# Patient Record
Sex: Male | Born: 1985 | Race: White | Hispanic: No | Marital: Single
Health system: Midwestern US, Community
[De-identification: ages and names within clinical notes are randomized; demographics above are authoritative.]

## PROBLEM LIST (undated history)

## (undated) HISTORY — PX: JOINT REPLACEMENT: SHX530

---

## 2016-06-23 ENCOUNTER — Encounter (HOSPITAL_BASED_OUTPATIENT_CLINIC_OR_DEPARTMENT_OTHER): Payer: Self-pay | Admitting: Emergency Medicine

## 2016-06-23 ENCOUNTER — Emergency Department (HOSPITAL_BASED_OUTPATIENT_CLINIC_OR_DEPARTMENT_OTHER)
Admission: EM | Admit: 2016-06-23 | Discharge: 2016-06-23 | Disposition: A | Discharge status: Home / Self Care | Payer: 59 | Attending: Emergency Medicine | Admitting: Emergency Medicine

## 2016-06-23 DIAGNOSIS — E860 Dehydration: Secondary | ICD-10-CM | POA: Diagnosis not present

## 2016-06-23 DIAGNOSIS — R109 Unspecified abdominal pain: Secondary | ICD-10-CM | POA: Insufficient documentation

## 2016-06-23 DIAGNOSIS — R079 Chest pain, unspecified: Secondary | ICD-10-CM | POA: Insufficient documentation

## 2016-06-23 DIAGNOSIS — H539 Unspecified visual disturbance: Secondary | ICD-10-CM | POA: Insufficient documentation

## 2016-06-23 DIAGNOSIS — J029 Acute pharyngitis, unspecified: Secondary | ICD-10-CM | POA: Diagnosis not present

## 2016-06-23 DIAGNOSIS — F17210 Nicotine dependence, cigarettes, uncomplicated: Secondary | ICD-10-CM | POA: Insufficient documentation

## 2016-06-23 DIAGNOSIS — R112 Nausea with vomiting, unspecified: Secondary | ICD-10-CM

## 2016-06-23 LAB — CBC WITH DIFFERENTIAL/PLATELET
Basophils Absolute: 0 10*3/uL (ref 0.0–0.1)
Basophils Relative: 0 %
EOS ABS: 0.1 10*3/uL (ref 0.0–0.7)
EOS PCT: 1 %
HCT: 47.2 % (ref 39.0–52.0)
Hemoglobin: 17 g/dL (ref 13.0–17.0)
LYMPHS ABS: 1.1 10*3/uL (ref 0.7–4.0)
Lymphocytes Relative: 14 %
MCH: 32.6 pg (ref 26.0–34.0)
MCHC: 36 g/dL (ref 30.0–36.0)
MCV: 90.4 fL (ref 78.0–100.0)
MONOS PCT: 10 %
Monocytes Absolute: 0.8 10*3/uL (ref 0.1–1.0)
Neutro Abs: 6.1 10*3/uL (ref 1.7–7.7)
Neutrophils Relative %: 75 %
PLATELETS: 236 10*3/uL (ref 150–400)
RBC: 5.22 MIL/uL (ref 4.22–5.81)
RDW: 12.6 % (ref 11.5–15.5)
WBC: 8.1 10*3/uL (ref 4.0–10.5)

## 2016-06-23 LAB — COMPREHENSIVE METABOLIC PANEL
ALT: 32 U/L (ref 17–63)
ANION GAP: 13 (ref 5–15)
AST: 49 U/L — ABNORMAL HIGH (ref 15–41)
Albumin: 4.6 g/dL (ref 3.5–5.0)
Alkaline Phosphatase: 78 U/L (ref 38–126)
BUN: 14 mg/dL (ref 6–20)
CHLORIDE: 98 mmol/L — AB (ref 101–111)
CO2: 26 mmol/L (ref 22–32)
Calcium: 10 mg/dL (ref 8.9–10.3)
Creatinine, Ser: 1.04 mg/dL (ref 0.61–1.24)
GFR calc non Af Amer: >60 mL/min (ref >60)
Glucose, Bld: 127 mg/dL — ABNORMAL HIGH (ref 65–99)
Potassium: 3.2 mmol/L — ABNORMAL LOW (ref 3.5–5.1)
SODIUM: 137 mmol/L (ref 135–145)
Total Bilirubin: 1.7 mg/dL — ABNORMAL HIGH (ref 0.3–1.2)
Total Protein: 8.4 g/dL — ABNORMAL HIGH (ref 6.5–8.1)

## 2016-06-23 LAB — URINALYSIS, ROUTINE W REFLEX MICROSCOPIC
Glucose, UA: NEGATIVE mg/dL
Hgb urine dipstick: NEGATIVE
Ketones, ur: 15 mg/dL — AB
Nitrite: NEGATIVE
Protein, ur: 100 mg/dL — AB
Specific Gravity, Urine: 1.028 (ref 1.005–1.030)
pH: 8 (ref 5.0–8.0)

## 2016-06-23 LAB — ETHANOL: Alcohol, Ethyl (B): <5 mg/dL (ref <5)

## 2016-06-23 LAB — LIPASE, BLOOD: Lipase: 32 U/L (ref 11–51)

## 2016-06-23 LAB — URINALYSIS, MICROSCOPIC (REFLEX)

## 2016-06-23 MED ORDER — ONDANSETRON HCL 4 MG/2ML IJ SOLN
4.0000 mg | Freq: Once | INTRAMUSCULAR | Status: AC
  Administered 2016-06-23: 4 mg via INTRAVENOUS
  Filled 2016-06-23: qty 2

## 2016-06-23 MED ORDER — SODIUM CHLORIDE 0.9 % IV BOLUS (SEPSIS)
1000.0000 mL | Freq: Once | INTRAVENOUS | Status: AC
  Administered 2016-06-23: 1000 mL via INTRAVENOUS

## 2016-06-23 MED ORDER — FAMOTIDINE 20 MG PO TABS: 20.0000 mg | ORAL_TABLET | Freq: Two times a day (BID) | ORAL | 0 refills | Status: AC

## 2016-06-23 MED ORDER — ONDANSETRON HCL 4 MG PO TABS: 4.0000 mg | ORAL_TABLET | Freq: Four times a day (QID) | ORAL | 0 refills | Status: AC

## 2016-06-23 MED ORDER — FAMOTIDINE IN NACL 20-0.9 MG/50ML-% IV SOLN
20.0000 mg | Freq: Once | INTRAVENOUS | Status: AC
  Administered 2016-06-23: 20 mg via INTRAVENOUS
  Filled 2016-06-23: qty 50

## 2016-06-23 MED ORDER — POTASSIUM CHLORIDE CRYS ER 20 MEQ PO TBCR
60.0000 meq | EXTENDED_RELEASE_TABLET | Freq: Once | ORAL | Status: AC
  Administered 2016-06-23: 60 meq via ORAL
  Filled 2016-06-23: qty 3

## 2016-06-23 NOTE — ED Notes (Signed)
Crackers and ginger ale given to patient.

## 2016-06-23 NOTE — ED Triage Notes (Signed)
Per Mom, has been binge drinking this week since breaking up with girlfriend. Last had ETOH yesterday at 0200. N/V since yesterday, no diarrhea. Anxious, not sleeping

## 2016-06-23 NOTE — ED Provider Notes (Signed)
[facility] Provider Note   CSN: [id number] Arrival date & time: 06/23/16  1603  By signing my name below, I, Brad Brandt, attest that this documentation has been prepared under the direction and in the presence of Brad Ream, PA-C. Electronically Signed: Alyssa Brandt, ED Scribe. 06/23/16. 5:24 PM.   History   Chief Complaint Chief Complaint  Patient presents with  . Vomiting   The history is provided by the patient and a parent. No language interpreter was used.   HPI Comments: Brad Brandt is a 31 y.o. male who presents to the Emergency Department complaining of persistent vomiting beginning yesterday morning. Pt states he was binge drinking from 2/26-3/2 and consumed about 18 beers and 6-7 shots of liquor each day. He reports associated tremors, abdominal pain, sore throat, diaphoresis, intermittent lightheadedness, headache, nausea, chest pain. He states chest pain and sore throat are secondary to vomiting. No treatments tried. Pt reports occasional marijuana usage, but not within the last week. Denies other illicit drug usage. Denies visual or auditory hallucinations.  Mother is concerned about pt's increased "mouth breathing" and increased facial redness.  History reviewed. No pertinent past medical history.  There are no active problems to display for this patient.   Past Surgical History:  Procedure Laterality Date  . JOINT REPLACEMENT         Home Medications    Prior to Admission medications   Medication Sig Start Date End Date Taking? Authorizing Provider  famotidine (PEPCID) 20 MG tablet Take 1 tablet (20 mg total) by mouth 2 (two) times daily. 06/23/16   Brad Brandt M Argusta Mcgann, PA-C  ondansetron (ZOFRAN) 4 MG tablet Take 1 tablet (4 mg total) by mouth every 6 (six) hours. 06/23/16   Emi Holes, PA-C   Family History History reviewed. No pertinent family history.  Social History Social History  Substance Use Topics  . Smoking status: Current Every  Day Smoker    Types: Cigarettes  . Smokeless tobacco: Never Used  . Alcohol use Yes     Comment: usually drinks on weekends but has been on a binge this week    Allergies   Patient has no known allergies.  Review of Systems Review of Systems  Constitutional: Positive for diaphoresis. Negative for chills and fever.  HENT: Positive for sore throat. Negative for facial swelling.   Eyes: Positive for visual disturbance.  Respiratory: Negative for shortness of breath.   Cardiovascular: Positive for chest pain.  Gastrointestinal: Positive for abdominal pain, nausea and vomiting.  Genitourinary: Negative for dysuria.  Musculoskeletal: Negative for back pain.  Skin: Negative for rash and wound.  Neurological: Positive for tremors, light-headedness and headaches.  Psychiatric/Behavioral: Negative for hallucinations. The patient is not nervous/anxious.    Physical Exam Updated Vital Signs BP 142/79 (BP Location: Right Arm)   Pulse 66   Temp 97.8 F (36.6 C) (Oral)   Resp 18   Ht 6\' 2"  (1.88 m)   Wt 82.6 kg   SpO2 98%   BMI 23.37 kg/m   Physical Exam  Constitutional: He appears well-developed and well-nourished. No distress.  flushed  HENT:  Head: Normocephalic and atraumatic.  Mouth/Throat: Oropharynx is clear and moist. No oropharyngeal exudate.  Eyes: Conjunctivae are normal. Pupils are equal, round, and reactive to light. Right eye exhibits no discharge. Left eye exhibits no discharge. No scleral icterus.  Neck: Normal range of motion. Neck supple. No thyromegaly present.  Cardiovascular: Normal rate, regular rhythm, normal heart sounds and intact distal pulses.  Exam reveals no gallop and no friction rub.   No murmur heard. Pulmonary/Chest: Effort normal and breath sounds normal. No stridor. No respiratory distress. He has no wheezes. He has no rales.  Abdominal: Soft. Bowel sounds are normal. He exhibits no distension. There is no tenderness. There is no rebound and no  guarding.  Musculoskeletal: He exhibits no edema.  Lymphadenopathy:    He has no cervical adenopathy.  Neurological: He is alert. Coordination normal.  CN 3-12 intact; normal sensation throughout; 5/5 strength in all 4 extremities; equal bilateral grip strength; no ataxia on finger to nose  Skin: Skin is warm and dry. No rash noted. He is not diaphoretic. No pallor.  Psychiatric: He has a normal mood and affect.  Nursing note and vitals reviewed.  ED Treatments / Results  DIAGNOSTIC STUDIES: Oxygen Saturation is 100% on RA, normal by my interpretation.    COORDINATION OF CARE: 5:14 PM Discussed treatment plan with pt at bedside which includes Urinalysis, CBC with differential, CMP, Lipase and Zofran and pt agreed to plan.  Labs (all labs ordered are listed, but only abnormal results are displayed) Labs Reviewed  COMPREHENSIVE METABOLIC PANEL - Abnormal; Notable for the following:       Result Value   Potassium 3.2 (*)    Chloride 98 (*)    Glucose, Bld 127 (*)    Total Protein 8.4 (*)    AST 49 (*)    Total Bilirubin 1.7 (*)    All other components within normal limits  URINALYSIS, ROUTINE W REFLEX MICROSCOPIC - Abnormal; Notable for the following:    Color, Urine ORANGE (*)    APPearance CLOUDY (*)    Bilirubin Urine SMALL (*)    Ketones, ur 15 (*)    Protein, ur 100 (*)    Leukocytes, UA SMALL (*)    All other components within normal limits  URINALYSIS, MICROSCOPIC (REFLEX) - Abnormal; Notable for the following:    Bacteria, UA FEW (*)    Squamous Epithelial / LPF 0-5 (*)    All other components within normal limits  CBC WITH DIFFERENTIAL/PLATELET  LIPASE, BLOOD  ETHANOL    EKG  EKG Interpretation None       Radiology No results found.  Procedures Procedures (including critical care time)  Medications Ordered in ED Medications  ondansetron (ZOFRAN) injection 4 mg (4 mg Intravenous Given 06/23/16 1653)  sodium chloride 0.9 % bolus 1,000 mL (0 mLs  Intravenous Stopped 06/23/16 2119)  famotidine (PEPCID) IVPB 20 mg premix (0 mg Intravenous Stopped 06/23/16 2119)  potassium chloride SA (K-DUR,KLOR-CON) CR tablet 60 mEq (60 mEq Oral Given 06/23/16 2120)     Initial Impression / Assessment and Plan / ED Course  I have reviewed the triage vital signs and the nursing notes.  Pertinent labs & imaging results that were available during my care of the patient were reviewed by me and considered in my medical decision making (see chart for details).     Patient with probable significant dehydration. UA shows small bilirubin, 15 ketones, 100 protein, small leukocytes, few bacteria. CBC unremarkable. CMP shows potassium 3.2 (replaced in the ED), chloride 98, glucose 127, protein 8.4, AST 49, total bilirubin 1.7. Lipase 32. Ethanol level negative. Patient given 3 L of saline in the ED with significant improvement of symptoms. Patient without delirium tremens. Patient advised to avoid alcohol. Discharge home with Pepcid and Zofran. Return precautions discussed. Patient understands and agrees with plan. Patient vitals stable throughout ED course discharged  in satisfactory condition. Patient also evaluated by Dr. Denton Lank who guided the patient's management and agrees with plan.  Final Clinical Impressions(s) / ED Diagnoses   Final diagnoses:  Dehydration  Non-intractable vomiting with nausea, unspecified vomiting type    New Prescriptions Discharge Medication List as of 06/23/2016  9:04 PM    START taking these medications   Details  famotidine (PEPCID) 20 MG tablet Take 1 tablet (20 mg total) by mouth 2 (two) times daily., Starting Sun 06/23/2016, Print    ondansetron (ZOFRAN) 4 MG tablet Take 1 tablet (4 mg total) by mouth every 6 (six) hours., Starting Sun 06/23/2016, Print      I personally performed the services described in this documentation, which was scribed in my presence. The recorded information has been reviewed and is accurate.    [address], PA-C 06/25/16 1610    Cathren Laine, MD 06/25/16 [phone]

## 2016-06-23 NOTE — ED Notes (Signed)
Pt reports nausea and vomiting and diarrhea, abdominal pain today. .Marland Kitchen

## 2016-06-23 NOTE — Discharge Instructions (Signed)
Medications: Zofran, Pepcid  Treatment: Take Zofran every 6 hours as needed for nausea or vomiting. Take Pepcid twice daily as prescribed. Make sure to drink plenty of water. Begin with bland foods such as bananas, rice, applesauce, toast, baked chicken. Avoid alcohol.  Follow-up: Please return to the emergency department if you develop any new or worsening symptoms including intractable vomiting, fevers, tremors, hallucinations, or any other concerning symptoms.

## 2019-08-04 ENCOUNTER — Ambulatory Visit: Attending: Registered Nurse

## 2019-08-04 ENCOUNTER — Ambulatory Visit: Admit: 2019-08-04 | Discharge: 2019-08-04 | Payer: Worker's Compensation | Attending: Registered Nurse

## 2019-08-04 DIAGNOSIS — S61217A Laceration without foreign body of left little finger without damage to nail, initial encounter: Secondary | ICD-10-CM

## 2019-08-04 NOTE — Progress Notes (Signed)
Progress Notes by Roque Lias, FNP at 08/04/19 1300                Author: Roque Lias, FNP  Service: --  Author Type: Nurse Practitioner       Filed: 08/04/19 1648  Encounter Date: 08/04/2019  Status: Signed          Editor: Roque Lias, FNP (Nurse Practitioner)                    Lise Auer. Grace Isaac, FNP-C   [facility]   [address] [place], [place] [address]   [phone]      SUBJECTIVE:    34 y.o. male sustained laceration  of finger approximately 30 minutes ago ago. Nature of injury: Patient was moving/pushing sheet metal around at work, when a piece sliced into his left fifth finger . Tetanus vaccination status reviewed: tetanus status unknown to the patient.       OBJECTIVE:    Patient appears well, vitals are normal. Laceration 2 cm noted.  Description: clean wound edges, no foreign bodies. Neurovascular and tendon structures are intact.      ASSESSMENT:    Laceration as described.      PLAN:    Anesthesia with 1% Lidocaine without Epinephrine. Wound cleansed, debrided of visible foreign material, and sutured using 3.o Vicryl. Antibiotic ointment and dressing applied.  Wound care instructions provided.  Observe for any signs of infection or other  problems.  Return for suture removal in 12-14 days. Patient lives in [place] and is just here working. He can return here for suture removal or to his PCP. If any problems or concerns develop, please call or return to [facility].                ICD-10-CM  ICD-9-CM             1.  Laceration of left little finger without foreign body without damage to nail, initial encounter   S61.217A  883.0  TETANUS, DIPHTHERIA TOXOIDS AND ACELLULAR PERTUSSIS VACCINE (TDAP), IN INDIVIDS. >=7, IM                RESUPERF WND BODY <2.5CM           2.  Immunization due   Z23  V05.9  TETANUS, DIPHTHERIA TOXOIDS AND ACELLULAR PERTUSSIS VACCINE (TDAP), IN INDIVIDS. >=7, IM        Brecken Dewoody L. Grace Isaac, FNP-C

## 2019-08-04 NOTE — Progress Notes (Signed)
Shaterica Mcclatchy L. Grace Isaac, FNP-C  [facility]  [address] [place], [place] [address]  [phone]    SUBJECTIVE:   34 y.o. male sustained laceration of finger approximately 30 minutes ago ago. Nature of injury: Patient was moving/pushing sheet metal around at work, when a piece sliced into his left fifth finger . Tetanus vaccination status reviewed: tetanus status unknown to the patient.     OBJECTIVE:   Patient appears well, vitals are normal. Laceration 2 cm noted.  Description: clean wound edges, no foreign bodies. Neurovascular and tendon structures are intact.    ASSESSMENT:   Laceration as described.    PLAN:   Anesthesia with 1% Lidocaine without Epinephrine. Wound cleansed, debrided of visible foreign material, and sutured using 3.o Vicryl. Antibiotic ointment and dressing applied.  Wound care instructions provided.  Observe for any signs of infection or other problems.  Return for suture removal in 12-14 days. Patient lives in [place] and is just here working. He can return here for suture removal or to his PCP. If any problems or concerns develop, please call or return to [facility].      ICD-10-CM ICD-9-CM    1. Laceration of left little finger without foreign body without damage to nail, initial encounter  S61.217A 883.0 TETANUS, DIPHTHERIA TOXOIDS AND ACELLULAR PERTUSSIS VACCINE (TDAP), IN INDIVIDS. >=7, IM      RESUPERF WND BODY <2.5CM   2. Immunization due  Z23 V05.9 TETANUS, DIPHTHERIA TOXOIDS AND ACELLULAR PERTUSSIS VACCINE (TDAP), IN INDIVIDS. >=7, IM     Paulette Lynch L. Grace Isaac, FNP-C

## 2020-04-09 ENCOUNTER — Emergency Department (HOSPITAL_BASED_OUTPATIENT_CLINIC_OR_DEPARTMENT_OTHER): Payer: 59

## 2020-04-09 ENCOUNTER — Encounter (HOSPITAL_BASED_OUTPATIENT_CLINIC_OR_DEPARTMENT_OTHER): Payer: Self-pay | Admitting: *Deleted

## 2020-04-09 ENCOUNTER — Emergency Department (HOSPITAL_BASED_OUTPATIENT_CLINIC_OR_DEPARTMENT_OTHER)
Admission: EM | Admit: 2020-04-09 | Discharge: 2020-04-09 | Disposition: A | Discharge status: Home / Self Care | Payer: 59 | Attending: Emergency Medicine | Admitting: Emergency Medicine

## 2020-04-09 DIAGNOSIS — W228XXA Striking against or struck by other objects, initial encounter: Secondary | ICD-10-CM | POA: Insufficient documentation

## 2020-04-09 DIAGNOSIS — S99911A Unspecified injury of right ankle, initial encounter: Secondary | ICD-10-CM

## 2020-04-09 DIAGNOSIS — Z23 Encounter for immunization: Secondary | ICD-10-CM | POA: Insufficient documentation

## 2020-04-09 DIAGNOSIS — Y9353 Activity, golf: Secondary | ICD-10-CM | POA: Insufficient documentation

## 2020-04-09 DIAGNOSIS — Z9660 Presence of unspecified orthopedic joint implant: Secondary | ICD-10-CM | POA: Diagnosis not present

## 2020-04-09 DIAGNOSIS — F17200 Nicotine dependence, unspecified, uncomplicated: Secondary | ICD-10-CM | POA: Insufficient documentation

## 2020-04-09 DIAGNOSIS — S81811A Laceration without foreign body, right lower leg, initial encounter: Secondary | ICD-10-CM

## 2020-04-09 DIAGNOSIS — S91011A Laceration without foreign body, right ankle, initial encounter: Secondary | ICD-10-CM | POA: Diagnosis not present

## 2020-04-09 MED ORDER — TETANUS-DIPHTH-ACELL PERTUSSIS 5-2.5-18.5 LF-MCG/0.5 IM SUSY
0.5000 mL | PREFILLED_SYRINGE | Freq: Once | INTRAMUSCULAR | Status: AC
  Administered 2020-04-09: 19:00:00 0.5 mL via INTRAMUSCULAR
  Filled 2020-04-09: qty 0.5

## 2020-04-09 MED ORDER — NAPROXEN 500 MG PO TABS: 500.0000 mg | ORAL_TABLET | Freq: Two times a day (BID) | ORAL | 0 refills | Status: AC | PRN

## 2020-04-09 NOTE — ED Provider Notes (Signed)
[facility] EMERGENCY DEPARTMENT Provider Note   CSN: [id number] Arrival date & time: 04/09/20  1608     History Chief Complaint  Patient presents with  . Laceration    Brad Brandt is a 34 y.o. male with a history of tobacco abuse who presents to the emergency department with complaints of right ankle injury which occurred yesterday.  Patient states he became frustrated and on the golf course and through his sandwich which then bounced back and struck him in the anterior lower leg/ankle.  This resulted in a laceration.  He cleaned the area with peroxide yesterday.  States has had a little bit of bleeding today, he is also having pain to the anterior ankle joint, worse with movement, no alleviating factors.  Denies fever, chills, numbness, tingling, weakness, or other areas of injury.  Unknown last tetanus.  HPI     History reviewed. No pertinent past medical history.  There are no problems to display for this patient.   Past Surgical History:  Procedure Laterality Date  . JOINT REPLACEMENT         No family history on file.  Social History   Tobacco Use  . Smoking status: Current Every Day Smoker    Types: Cigarettes  . Smokeless tobacco: Never Used  Substance Use Topics  . Alcohol use: Yes    Comment: usually drinks on weekends but has been on a binge this week    Home Medications Prior to Admission medications   Medication Sig Start Date End Date Taking? Authorizing Provider  famotidine (PEPCID) 20 MG tablet Take 1 tablet (20 mg total) by mouth 2 (two) times daily. 06/23/16   Law, Waylan Boga, PA-C  ondansetron (ZOFRAN) 4 MG tablet Take 1 tablet (4 mg total) by mouth every 6 (six) hours. 06/23/16   Emi Holes, PA-C    Allergies    Patient has no known allergies.  Review of Systems   Review of Systems  Constitutional: Negative for chills and fever.  Respiratory: Negative for shortness of breath.   Cardiovascular: Negative for chest pain.   Gastrointestinal: Negative for abdominal pain.  Musculoskeletal: Positive for arthralgias.  Skin: Positive for wound. Negative for color change.  Neurological: Negative for weakness and numbness.    Physical Exam Updated Vital Signs BP 125/82 (BP Location: Left Arm)   Pulse 79   Temp 98 F (36.7 C) (Oral)   Resp 18   Ht 6\' 2"  (1.88 m)   Wt 83.9 kg   SpO2 98%   BMI 23.75 kg/m   Physical Exam Vitals and nursing note reviewed.  Constitutional:      General: He is not in acute distress.    Appearance: He is not ill-appearing or toxic-appearing.  HENT:     Head: Normocephalic and atraumatic.  Cardiovascular:     Pulses:          Dorsalis pedis pulses are 2+ on the right side and 2+ on the left side.       Posterior tibial pulses are 2+ on the right side and 2+ on the left side.  Pulmonary:     Effort: Pulmonary effort is normal.  Musculoskeletal:     Comments: Lower extremities: Patient has an approximately 1.75 linear laceration to the anterior distal one third of the lower leg of the right lower extremity.  This is a few millimeters deep, no active bleeding or visible foreign bodies.  Skin appears to be coming back together some.  There is  some mild swelling in the area of the laceration.  Patient has intact AROM to bilateral hips, knees, ankles, and all digits. Tender to palpation directly over the wound as well as to the anterior ankle joint.  Otherwise nontender.  Hartman's are soft.  Skin:    General: Skin is warm and dry.     Capillary Refill: Capillary refill takes less than 2 seconds.  Neurological:     Mental Status: He is alert.     Comments: Alert. Clear speech. Sensation grossly intact to bilateral lower extremities. 5/5 strength with plantar/dorsiflexion bilaterally. Patient ambulatory.   Psychiatric:        Mood and Affect: Mood normal.        Behavior: Behavior normal.     ED Results / Procedures / Treatments   Labs (all labs ordered are listed, but only  abnormal results are displayed) Labs Reviewed - No data to display  EKG None  Radiology No results found.  Procedures Procedures (including critical care time)  Medications Ordered in ED Medications  Tdap (BOOSTRIX) injection 0.5 mL (has no administration in time range)    ED Course  I have reviewed the triage vital signs and the nursing notes.  Pertinent labs & imaging results that were available during my care of the patient were reviewed by me and considered in my medical decision making (see chart for details).    MDM Rules/Calculators/A&P                         Patient presents to the emergency department status post right ankle injury yesterday.  He is nontoxic, resting comfortably, vitals are within normal limits.  In terms of his laceration, this was cleansed, does not show evidence of foreign body, no active bleeding.  Given wound is from almost 24 hours ago at this point do not feel that closure would be beneficial.  Will need to heal by secondary to intention.  Discussed wound care. Tetanus updated.  I ordered an x-ray for further evaluation, I personally reviewed and interpreted, no fracture or dislocation, no visible opaque foreign body.  Will provide ASO.  Patient appears appropriate for discharge. I discussed results, treatment plan, need for follow-up, and return precautions with the patient. Provided opportunity for questions, patient confirmed understanding and is in agreement with plan.   Final Clinical Impression(s) / ED Diagnoses Final diagnoses:  Laceration of right lower extremity, initial encounter  Injury of right ankle, initial encounter    Rx / DC Orders ED Discharge Orders         Ordered    naproxen (NAPROSYN) 500 MG tablet  2 times daily PRN        04/09/20 1908           Cherly Anderson, PA-C 04/09/20 1910    Rolan Bucco, MD 04/09/20 2220

## 2020-04-09 NOTE — ED Triage Notes (Signed)
Pt reports he was playing golf yesterday and threw his club and it hit the cart and bounced back into his right ankle. Pt reports he cleaned it out and put butterfly stitches on it

## 2020-04-09 NOTE — Discharge Instructions (Signed)
You were seen in the emergency department today for an injury to your right ankle/lower leg.  Your x-ray does not show any fractures or dislocations.  We have cleaned your wound and applied a dressing.  We have placed you in an ankle splint, please wear this for stability/comfort.   Please see attached wound care instructions.    We are sending you home with naproxen to treat for pain/swelling.  - Naproxen is a nonsteroidal anti-inflammatory medication that will help with pain and swelling. Be sure to take this medication as prescribed with food, 1 pill every 12 hours,  It should be taken with food, as it can cause stomach upset, and more seriously, stomach bleeding. Do not take other nonsteroidal anti-inflammatory medications with this such as Advil, Motrin, Aleve, Mobic, Goodie Powder, or Motrin.    You make take Tylenol per over the counter dosing with these medications.   We have prescribed you new medication(s) today. Discuss the medications prescribed today with your pharmacist as they can have adverse effects and interactions with your other medicines including over the counter and prescribed medications. Seek medical evaluation if you start to experience new or abnormal symptoms after taking one of these medicines, seek care immediately if you start to experience difficulty breathing, feeling of your throat closing, facial swelling, or rash as these could be indications of a more serious allergic reaction   Please talk with your primary care provider within 1 week for reevaluation of your injury.  Return to the ER for new or worsening symptoms including but not limited to increased pain, redness around your wound, pus draining from the wound, fever, chills,  or any other concern.

## 2022-05-19 IMAGING — CR DG ANKLE COMPLETE 3+V*R*
3 series · 3 of 3 positions shown · non-contrast
Comparison: None.

CLINICAL DATA: Pain

EXAM:
RIGHT ANKLE - COMPLETE 3+ VIEW

[t ankle joint ap right]
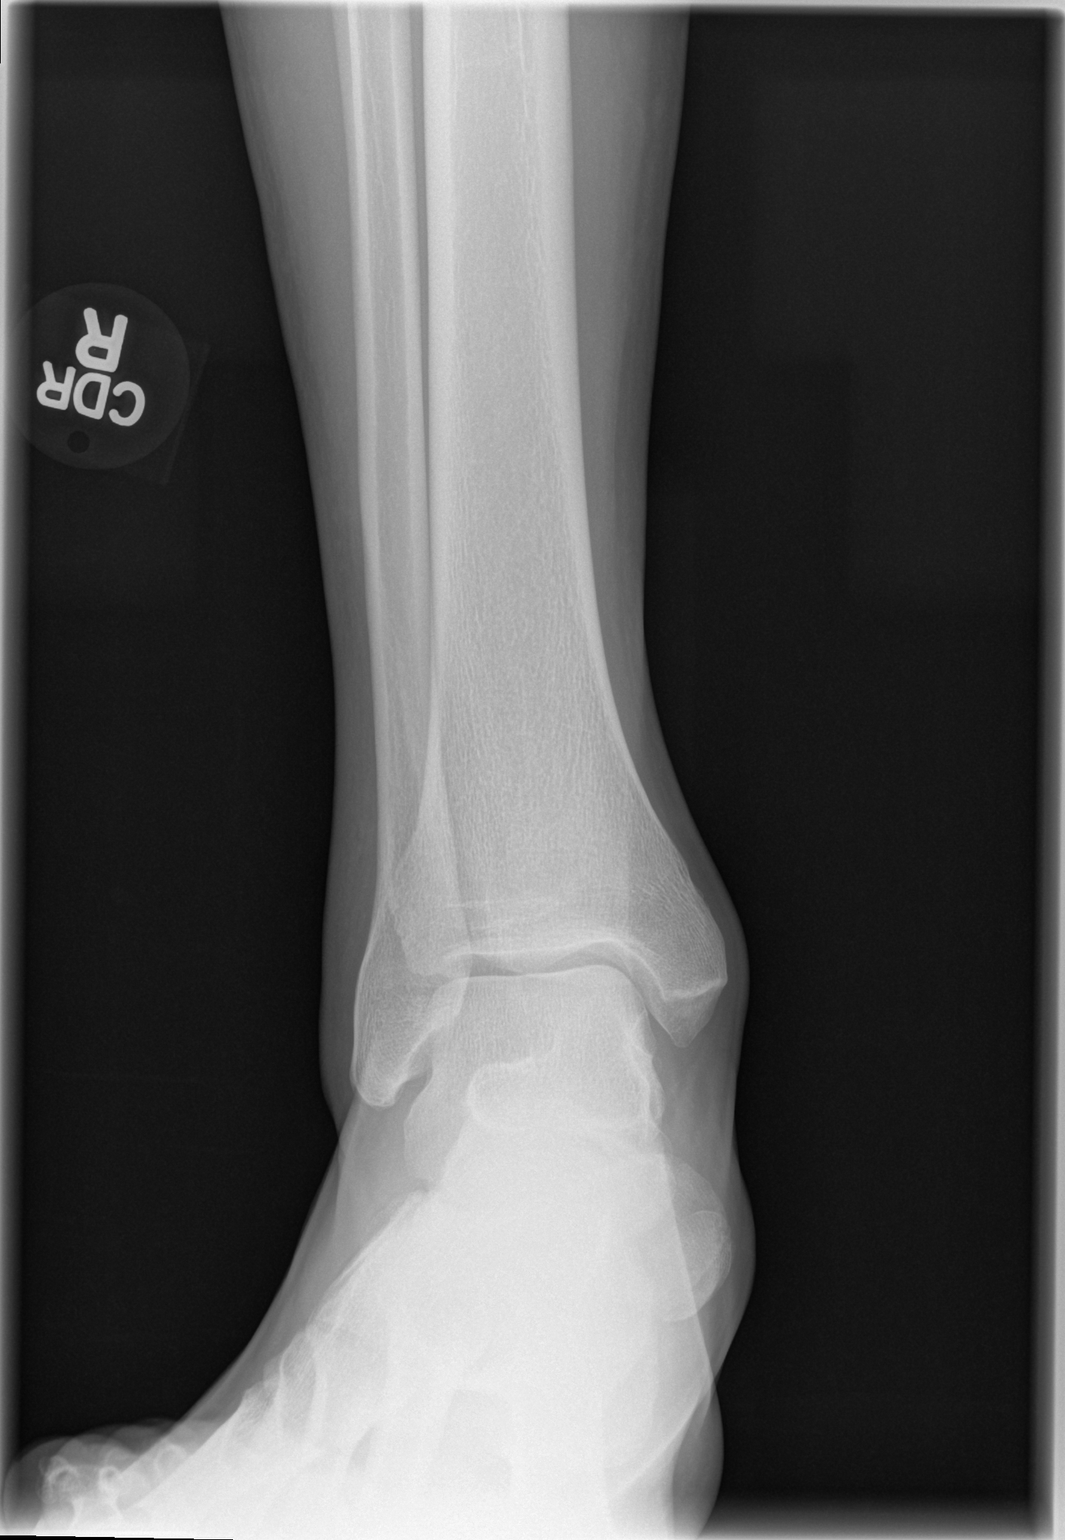

[t ankle joint oblique right]
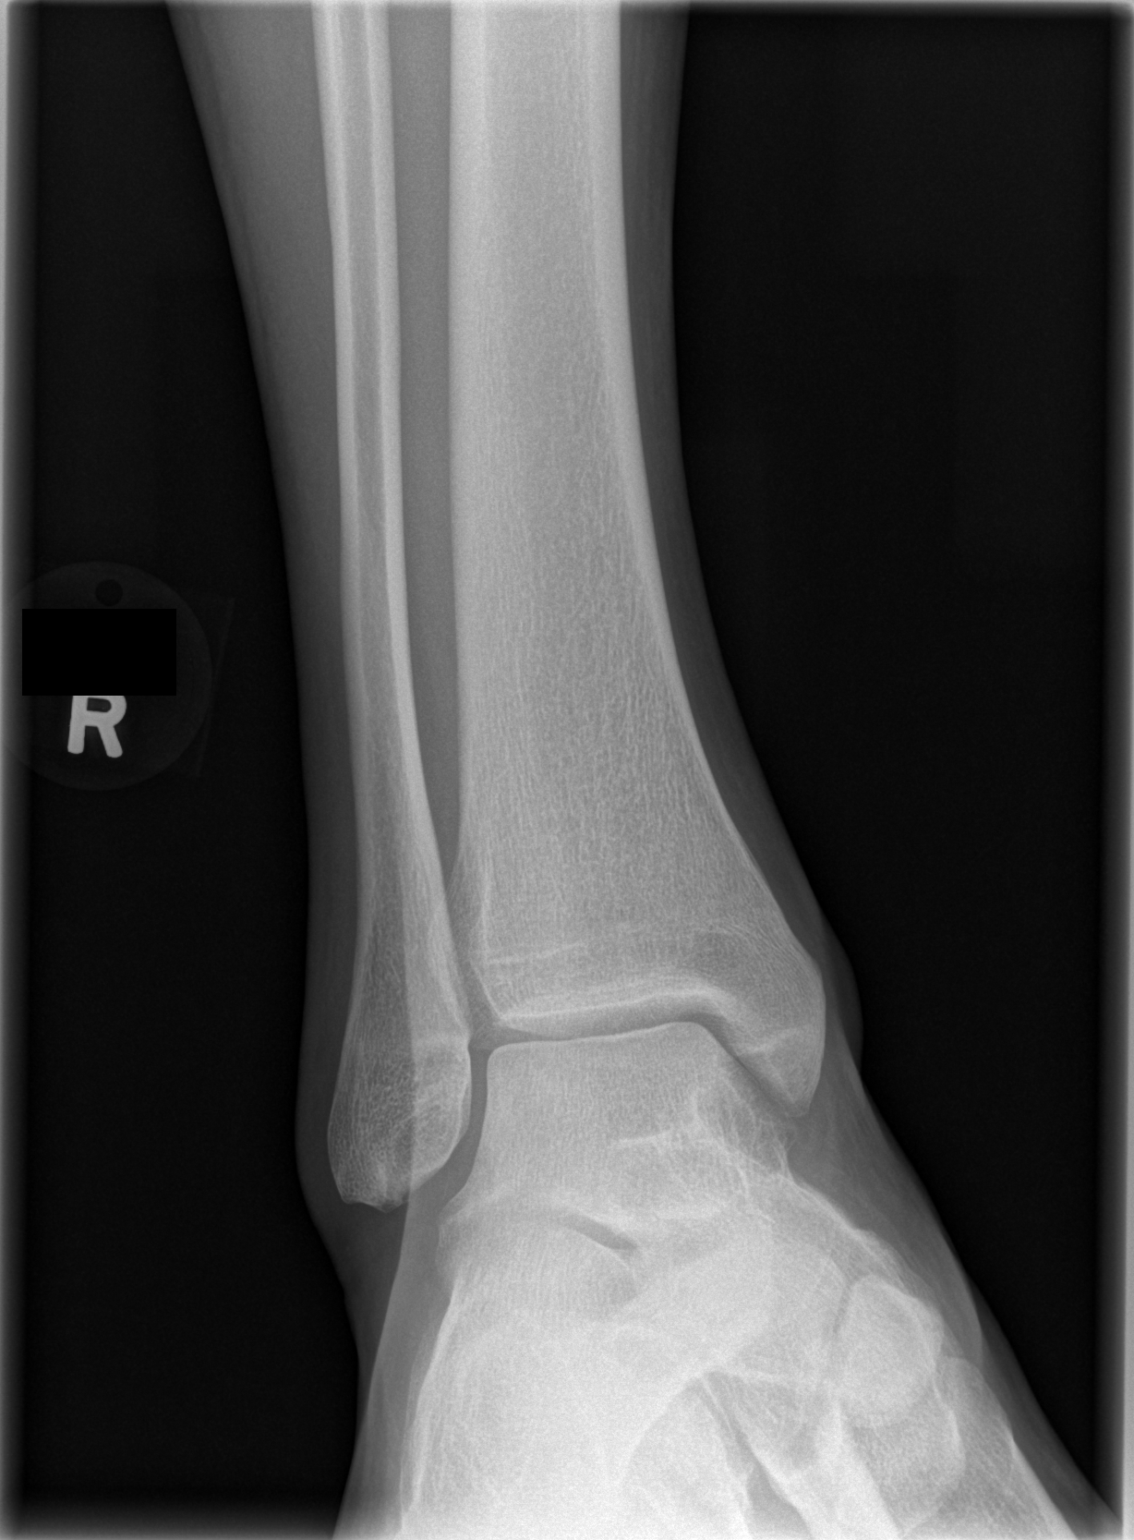

[t ankle joint lat right]
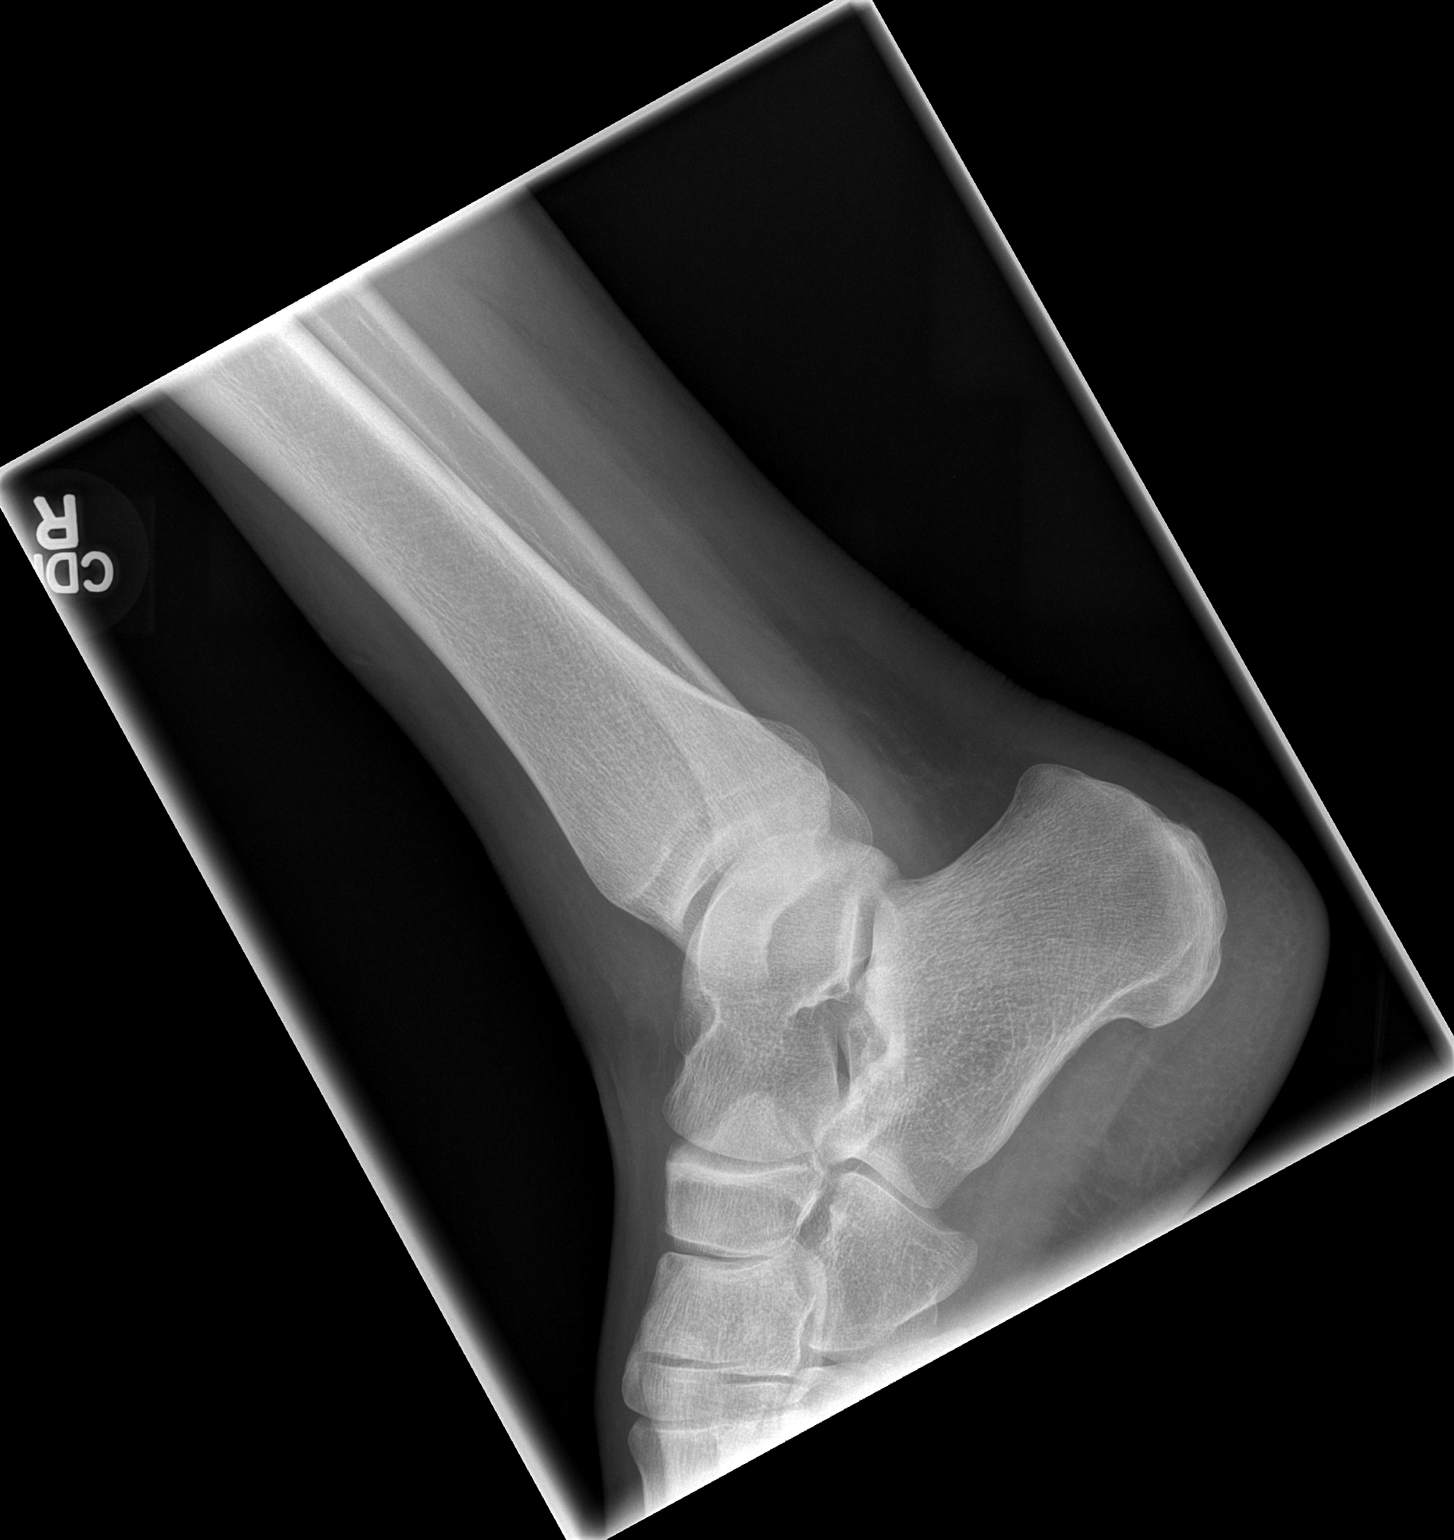

[3 of 3 positions shown; findings below may reference images not displayed]

FINDINGS: There is no evidence of fracture, dislocation, or joint effusion.
There is no evidence of arthropathy or other focal bone abnormality.
Soft tissues are unremarkable.
IMPRESSION: Negative.
